# Patient Record
Sex: Female | Born: 2005
Health system: Southern US, Community
[De-identification: ages and names within clinical notes are randomized; demographics above are authoritative.]

## PROBLEM LIST (undated history)

## (undated) DIAGNOSIS — T7840XA Allergy, unspecified, initial encounter: Secondary | ICD-10-CM

## (undated) HISTORY — DX: Allergy, unspecified, initial encounter: T78.40XA

---
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
## 2023-01-09 ENCOUNTER — Other Ambulatory Visit (HOSPITAL_COMMUNITY): Payer: Self-pay

## 2023-01-09 MED ORDER — SULFAMETHOXAZOLE-TRIMETHOPRIM 400-80 MG PO TABS
1.0000 | ORAL_TABLET | Freq: Every day | ORAL | 10 refills | Status: AC
  Filled 2023-01-09: qty 30, 30d supply, fill #0
  Filled 2023-02-06: qty 30, 30d supply, fill #1
  Filled 2023-03-06: qty 30, 30d supply, fill #2
  Filled 2023-04-06: qty 30, 30d supply, fill #3

## 2023-01-09 MED ORDER — NORETHIN ACE-ETH ESTRAD-FE 1-20 MG-MCG PO TABS
1.0000 | ORAL_TABLET | Freq: Every day | ORAL | 3 refills | Status: AC
  Filled 2023-01-09: qty 84, 84d supply, fill #0

## 2023-01-09 MED ORDER — PANTOPRAZOLE SODIUM 40 MG PO TBEC
40.0000 mg | DELAYED_RELEASE_TABLET | Freq: Two times a day (BID) | ORAL | 0 refills | Status: DC
  Filled 2023-01-09: qty 120, 60d supply, fill #0

## 2023-01-10 ENCOUNTER — Other Ambulatory Visit (HOSPITAL_COMMUNITY): Payer: Self-pay

## 2023-02-06 ENCOUNTER — Other Ambulatory Visit (HOSPITAL_COMMUNITY): Payer: Self-pay

## 2023-02-06 DIAGNOSIS — R112 Nausea with vomiting, unspecified: Secondary | ICD-10-CM | POA: Diagnosis not present

## 2023-02-20 ENCOUNTER — Other Ambulatory Visit (HOSPITAL_COMMUNITY): Payer: Self-pay

## 2023-02-20 DIAGNOSIS — Z113 Encounter for screening for infections with a predominantly sexual mode of transmission: Secondary | ICD-10-CM | POA: Diagnosis not present

## 2023-02-20 DIAGNOSIS — N898 Other specified noninflammatory disorders of vagina: Secondary | ICD-10-CM | POA: Diagnosis not present

## 2023-02-20 DIAGNOSIS — B3731 Acute candidiasis of vulva and vagina: Secondary | ICD-10-CM | POA: Diagnosis not present

## 2023-02-20 DIAGNOSIS — Z1389 Encounter for screening for other disorder: Secondary | ICD-10-CM | POA: Diagnosis not present

## 2023-02-20 DIAGNOSIS — Z01419 Encounter for gynecological examination (general) (routine) without abnormal findings: Secondary | ICD-10-CM | POA: Diagnosis not present

## 2023-02-20 DIAGNOSIS — N760 Acute vaginitis: Secondary | ICD-10-CM | POA: Diagnosis not present

## 2023-02-20 DIAGNOSIS — Z3041 Encounter for surveillance of contraceptive pills: Secondary | ICD-10-CM | POA: Diagnosis not present

## 2023-02-20 DIAGNOSIS — Z130 Encounter for screening for diseases of the blood and blood-forming organs and certain disorders involving the immune mechanism: Secondary | ICD-10-CM | POA: Diagnosis not present

## 2023-02-20 MED ORDER — NORETHIN ACE-ETH ESTRAD-FE 1-20 MG-MCG PO TABS
1.0000 | ORAL_TABLET | Freq: Every day | ORAL | 4 refills | Status: AC
  Filled 2023-02-20: qty 84, 84d supply, fill #0

## 2023-02-20 MED ORDER — FLUCONAZOLE 150 MG PO TABS
150.0000 mg | ORAL_TABLET | ORAL | 0 refills | Status: AC
  Filled 2023-02-20: qty 2, 6d supply, fill #0

## 2023-02-20 MED ORDER — METRONIDAZOLE 500 MG PO TABS
500.0000 mg | ORAL_TABLET | Freq: Two times a day (BID) | ORAL | 0 refills | Status: AC
  Filled 2023-02-20: qty 14, 7d supply, fill #0

## 2023-02-22 ENCOUNTER — Other Ambulatory Visit (HOSPITAL_COMMUNITY): Payer: Self-pay

## 2023-02-23 DIAGNOSIS — H5203 Hypermetropia, bilateral: Secondary | ICD-10-CM | POA: Diagnosis not present

## 2023-03-19 ENCOUNTER — Other Ambulatory Visit (HOSPITAL_COMMUNITY): Payer: Self-pay

## 2023-03-20 ENCOUNTER — Other Ambulatory Visit (HOSPITAL_COMMUNITY): Payer: Self-pay

## 2023-03-20 MED ORDER — PANTOPRAZOLE SODIUM 40 MG PO TBEC
40.0000 mg | DELAYED_RELEASE_TABLET | Freq: Two times a day (BID) | ORAL | 0 refills | Status: AC
  Filled 2023-03-20: qty 120, 60d supply, fill #0

## 2023-03-23 ENCOUNTER — Other Ambulatory Visit: Payer: Self-pay

## 2023-03-27 DIAGNOSIS — R112 Nausea with vomiting, unspecified: Secondary | ICD-10-CM | POA: Diagnosis not present

## 2023-03-27 DIAGNOSIS — K639 Disease of intestine, unspecified: Secondary | ICD-10-CM | POA: Diagnosis not present

## 2023-03-28 DIAGNOSIS — K639 Disease of intestine, unspecified: Secondary | ICD-10-CM | POA: Diagnosis not present

## 2023-04-06 DIAGNOSIS — K319 Disease of stomach and duodenum, unspecified: Secondary | ICD-10-CM | POA: Diagnosis not present

## 2023-04-06 DIAGNOSIS — K2970 Gastritis, unspecified, without bleeding: Secondary | ICD-10-CM | POA: Diagnosis not present

## 2023-04-06 DIAGNOSIS — K3189 Other diseases of stomach and duodenum: Secondary | ICD-10-CM | POA: Diagnosis not present

## 2023-04-06 DIAGNOSIS — R112 Nausea with vomiting, unspecified: Secondary | ICD-10-CM | POA: Diagnosis not present

## 2023-04-07 ENCOUNTER — Other Ambulatory Visit (HOSPITAL_COMMUNITY): Payer: Self-pay

## 2023-04-12 ENCOUNTER — Other Ambulatory Visit (HOSPITAL_COMMUNITY): Payer: Self-pay

## 2023-04-12 DIAGNOSIS — N390 Urinary tract infection, site not specified: Secondary | ICD-10-CM | POA: Diagnosis not present

## 2023-04-12 MED ORDER — SULFAMETHOXAZOLE-TRIMETHOPRIM 400-80 MG PO TABS
1.0000 | ORAL_TABLET | Freq: Every day | ORAL | 11 refills | Status: AC
  Filled 2023-04-12: qty 90, 90d supply, fill #0

## 2023-04-25 ENCOUNTER — Other Ambulatory Visit (HOSPITAL_COMMUNITY): Payer: Self-pay

## 2023-04-25 MED ORDER — SUCRALFATE 1 G PO TABS
1.0000 g | ORAL_TABLET | Freq: Two times a day (BID) | ORAL | 3 refills | Status: AC
  Filled 2023-04-25: qty 59, 29d supply, fill #0
  Filled 2023-04-25: qty 1, 1d supply, fill #0

## 2023-04-26 ENCOUNTER — Other Ambulatory Visit (HOSPITAL_COMMUNITY): Payer: Self-pay

## 2023-04-26 MED ORDER — SUCRALFATE 1 G PO TABS
1.0000 g | ORAL_TABLET | Freq: Three times a day (TID) | ORAL | 3 refills | Status: AC
  Filled 2023-04-26: qty 120, 30d supply, fill #0

## 2023-04-28 ENCOUNTER — Other Ambulatory Visit (HOSPITAL_COMMUNITY): Payer: Self-pay

## 2023-05-02 ENCOUNTER — Other Ambulatory Visit (HOSPITAL_COMMUNITY): Payer: Self-pay

## 2023-05-04 ENCOUNTER — Other Ambulatory Visit (HOSPITAL_COMMUNITY): Payer: Self-pay

## 2023-05-05 ENCOUNTER — Other Ambulatory Visit (HOSPITAL_COMMUNITY): Payer: Self-pay

## 2023-08-17 DIAGNOSIS — Z832 Family history of diseases of the blood and blood-forming organs and certain disorders involving the immune mechanism: Secondary | ICD-10-CM | POA: Diagnosis not present

## 2023-08-17 DIAGNOSIS — Z808 Family history of malignant neoplasm of other organs or systems: Secondary | ICD-10-CM | POA: Diagnosis not present

## 2023-08-17 DIAGNOSIS — N390 Urinary tract infection, site not specified: Secondary | ICD-10-CM | POA: Diagnosis not present

## 2023-08-17 DIAGNOSIS — Z0000 Encounter for general adult medical examination without abnormal findings: Secondary | ICD-10-CM | POA: Diagnosis not present

## 2024-02-07 ENCOUNTER — Other Ambulatory Visit (HOSPITAL_COMMUNITY): Payer: Self-pay

## 2024-02-07 MED ORDER — NORETHIN ACE-ETH ESTRAD-FE 1-20 MG-MCG PO TABS
1.0000 | ORAL_TABLET | Freq: Every day | ORAL | 2 refills | Status: AC
  Filled 2024-02-07: qty 84, 84d supply, fill #0
  Filled 2024-03-25: qty 84, 84d supply, fill #1

## 2024-02-08 ENCOUNTER — Other Ambulatory Visit (HOSPITAL_COMMUNITY): Payer: Self-pay

## 2024-02-21 ENCOUNTER — Other Ambulatory Visit (HOSPITAL_COMMUNITY): Payer: Self-pay

## 2024-02-21 DIAGNOSIS — Z1389 Encounter for screening for other disorder: Secondary | ICD-10-CM | POA: Diagnosis not present

## 2024-02-21 DIAGNOSIS — Z130 Encounter for screening for diseases of the blood and blood-forming organs and certain disorders involving the immune mechanism: Secondary | ICD-10-CM | POA: Diagnosis not present

## 2024-02-21 DIAGNOSIS — Z3041 Encounter for surveillance of contraceptive pills: Secondary | ICD-10-CM | POA: Diagnosis not present

## 2024-02-21 DIAGNOSIS — Z01419 Encounter for gynecological examination (general) (routine) without abnormal findings: Secondary | ICD-10-CM | POA: Diagnosis not present

## 2024-02-21 DIAGNOSIS — Z113 Encounter for screening for infections with a predominantly sexual mode of transmission: Secondary | ICD-10-CM | POA: Diagnosis not present

## 2024-02-21 MED ORDER — NORETHIN ACE-ETH ESTRAD-FE 1-20 MG-MCG PO TABS
1.0000 | ORAL_TABLET | Freq: Every day | ORAL | 4 refills | Status: AC
  Filled 2024-02-21: qty 84, 84d supply, fill #0

## 2024-02-22 ENCOUNTER — Other Ambulatory Visit (HOSPITAL_COMMUNITY): Payer: Self-pay

## 2024-03-22 ENCOUNTER — Other Ambulatory Visit (HOSPITAL_COMMUNITY): Payer: Self-pay

## 2024-03-25 ENCOUNTER — Other Ambulatory Visit (HOSPITAL_COMMUNITY): Payer: Self-pay

## 2024-03-27 ENCOUNTER — Other Ambulatory Visit (HOSPITAL_COMMUNITY): Payer: Self-pay

## 2024-03-27 DIAGNOSIS — L700 Acne vulgaris: Secondary | ICD-10-CM | POA: Diagnosis not present

## 2024-03-27 DIAGNOSIS — B079 Viral wart, unspecified: Secondary | ICD-10-CM | POA: Diagnosis not present

## 2024-03-27 MED ORDER — CLINDAMYCIN PHOSPHATE 1 % EX SOLN
CUTANEOUS | 99 refills | Status: AC
  Filled 2024-03-27: qty 60, 30d supply, fill #0
